# Patient Record
Sex: Female | Born: 1990 | Race: White | Hispanic: No | Marital: Single | Smoking: Never smoker
Health system: Southern US, Community
[De-identification: ages and names within clinical notes are randomized; demographics above are authoritative.]

## PROBLEM LIST (undated history)

## (undated) DIAGNOSIS — I471 Supraventricular tachycardia, unspecified: Secondary | ICD-10-CM

## (undated) DIAGNOSIS — J302 Other seasonal allergic rhinitis: Secondary | ICD-10-CM

## (undated) HISTORY — DX: Supraventricular tachycardia: I47.1

## (undated) HISTORY — DX: Other seasonal allergic rhinitis: J30.2

## (undated) HISTORY — DX: Supraventricular tachycardia, unspecified: I47.10

## (undated) HISTORY — PX: OTHER SURGICAL HISTORY: SHX169

---
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
## 2012-07-09 ENCOUNTER — Ambulatory Visit (INDEPENDENT_AMBULATORY_CARE_PROVIDER_SITE_OTHER): Payer: BC Managed Care – PPO | Admitting: Internal Medicine

## 2012-07-09 ENCOUNTER — Encounter: Payer: Self-pay | Admitting: Internal Medicine

## 2012-07-09 VITALS — BP 113/73 | HR 99 | Ht 62.0 in | Wt 127.0 lb

## 2012-07-09 DIAGNOSIS — I498 Other specified cardiac arrhythmias: Secondary | ICD-10-CM

## 2012-07-09 DIAGNOSIS — I471 Supraventricular tachycardia: Secondary | ICD-10-CM

## 2012-07-09 MED ORDER — DILTIAZEM HCL 30 MG PO TABS: ORAL_TABLET | ORAL | Status: AC

## 2012-07-09 NOTE — Patient Instructions (Addendum)
Your physician recommends that you schedule a follow-up appointment as needed with Dr Johney Frame    Your physician has recommended you make the following change in your medication:  1) Take 1-2 as needed for fast heart rates

## 2012-07-09 NOTE — Progress Notes (Signed)
Primary Care Physician: Cain Saupe, MD  Taylor Carroll is a 22 y.o. female with a h/o SVT who presents for EP consultation.  She reports that since age 68, she has had abrupt onset/ offset of tachypalpitations.  She reports associated presyncope with these episodes.  She reports having an episode with sneezing or coughing but typically, she is unaware of triggers or precipitants for this.  She had an episode 02/24/11 for which she presented to [facility].  She reports that she was having an allergic reaction to nuts and used her epi pen.  She had sustained tachycardia for several hours.  She presented with a short RP narrow complex tachycardia at 221 bpm.  This tachycardia was terminated with adenosine.  She reports associated chest pain and dizziness during episodes.  She has not found vagal maneuvers to be particularly helpful.  Since that time, she has had 2 episodes of tachycardia (2/14 and 5/14).  She presented to the hospital 5/14 but converted to sinus rhythm by the time she arrived.  She is an active 22 yo female.  She works out without difficult.  Today, she denies symptoms of exertional CP, shortness of breath, orthopnea, PND, lower extremity edema, syncope, or neurologic sequela. The patient is tolerating medications without difficulties and is otherwise without complaint today.   Past Medical History  Diagnosis Date  . SVT (supraventricular tachycardia)   . Seasonal allergies    Past Surgical History  Procedure Laterality Date  . None      Current Outpatient Prescriptions  Medication Sig Dispense Refill  . EPINEPHrine (EPI-PEN) 0.3 mg/0.3 mL DEVI Inject into the muscle once.      . Loratadine (CLARITIN PO) Take by mouth.      Taylor Carroll 3-0.02 MG tablet        No current facility-administered medications for this visit.    Allergies  Allergen Reactions  . Other     Tree nuts    History   Social History  . Marital Status: Single    Spouse Name: N/A    Number of  Children: N/A  . Years of Education: N/A   Occupational History  . Not on file.   Social History Main Topics  . Smoking status: Never Smoker   . Smokeless tobacco: Not on file  . Alcohol Use: Yes     Comment: social  . Drug Use: No  . Sexually Active: Not on file   Other Topics Concern  . Not on file   Social History Narrative   Lives in [place].  Attends school at [employer].  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  She wants to be an NP or PA.          Family History  Problem Relation Age of Onset  . Atrial fibrillation      ROS- All systems are reviewed and negative except as per the HPI above  Physical Exam: Filed Vitals:   07/09/12 1030  BP: 113/73  Pulse: 99  Height: 5\' 2"  (1.575 m)  Weight: 127 lb (57.607 kg)    GEN- The patient is well appearing, alert and oriented x 3 today.   Head- normocephalic, atraumatic Eyes-  Sclera clear, conjunctiva pink Ears- hearing intact Oropharynx- clear Neck- supple, no JVP Lymph- no cervical lymphadenopathy Lungs- Clear to ausculation bilaterally, normal work of breathing Heart- Regular rate and rhythm, no murmurs, rubs or gallops, PMI not laterally displaced GI- soft, NT, ND, + BS Extremities- no clubbing,  cyanosis, or edema MS- no significant deformity or atrophy Skin- no rash or lesion Psych- euthymic mood, full affect Neuro- strength and sensation are intact  EKG 02/24/11 reveals short RP SVT at 221 bpm ekg 02/24/11- sinus rhythm 112 bpm, otherwise normal ekg 16 pages from [facility] are reviewed today  Assessment and Plan:  1. SVT The patient has symptomatic short RP narrow complex tachycardia documented.  She has not found vagal maneuvers to be very helpful. Therapeutic strategies for supraventricular tachycardia including medicine and ablation were discussed in detail with the patient today. Risk, benefits, and alternatives to EP study and radiofrequency ablation were also discussed in detail today. These  risks include but are not limited to stroke, bleeding, vascular damage, tamponade, perforation, damage to the heart and other structures, AV block requiring pacemaker, worsening renal function, and death. The patient understands these risk and wishes to further contemplate this option.  She will take cardizem on an as needed basis. She will return to see me as needed.  She will contact my office if she decides to proceed with ablation.

## 2012-07-12 DIAGNOSIS — I471 Supraventricular tachycardia: Secondary | ICD-10-CM | POA: Insufficient documentation

## 2014-05-04 ENCOUNTER — Other Ambulatory Visit: Payer: Self-pay | Admitting: Family Medicine

## 2014-05-04 DIAGNOSIS — T7840XA Allergy, unspecified, initial encounter: Secondary | ICD-10-CM

## 2014-05-04 DIAGNOSIS — E010 Iodine-deficiency related diffuse (endemic) goiter: Secondary | ICD-10-CM

## 2014-06-10 ENCOUNTER — Ambulatory Visit
Admission: RE | Admit: 2014-06-10 | Discharge: 2014-06-10 | Disposition: A | Discharge status: Home / Self Care | Payer: BLUE CROSS/BLUE SHIELD | Source: Ambulatory Visit | Attending: Family Medicine | Admitting: Family Medicine

## 2014-06-10 DIAGNOSIS — T7840XA Allergy, unspecified, initial encounter: Secondary | ICD-10-CM

## 2014-06-10 DIAGNOSIS — E010 Iodine-deficiency related diffuse (endemic) goiter: Secondary | ICD-10-CM

## 2014-11-28 ENCOUNTER — Telehealth: Payer: Self-pay | Admitting: Internal Medicine

## 2014-11-28 NOTE — Telephone Encounter (Signed)
New message    1. What dental office are you calling from? [phone]  2. What is your office phone and fax number? [phone]   3. What type of procedure is the patient having performed? Oral surgery with general anest   4. What date is procedure scheduled? Pending    5. What is your question (ex. Antibiotics prior to procedure, holding medication-we need to know how long dentist wants pt to hold med)? Please advise

## 2014-11-28 NOTE — Telephone Encounter (Addendum)
Patient has not been seen since 2014.  Will need to be seen first.  Spoke with office to see if could get clearance form PCP MD would rather cardiolgy give.  A message has been left with patient to call to schedule

## 2014-11-29 ENCOUNTER — Telehealth: Payer: Self-pay | Admitting: Internal Medicine

## 2014-11-29 NOTE — Telephone Encounter (Signed)
New message   Pt mother is calling because she wants her daughter to see Dr. Johney FrameALLRED   Notes per Efraim Kaufmannmelissa states that pt needs surgical clearance appt  I offered her first available she [facility]

## 2014-11-29 NOTE — Telephone Encounter (Signed)
Follow Up    Pt states she was just speaking with you and she wants a call back

## 2014-11-29 NOTE — Telephone Encounter (Signed)
Will see Dr Johney FrameAllred on 12/19/14 at 9:00am

## 2014-11-30 NOTE — Telephone Encounter (Signed)
Changed appointment to better fir her schedule for grad school

## 2014-12-19 ENCOUNTER — Ambulatory Visit: Payer: BLUE CROSS/BLUE SHIELD | Admitting: Internal Medicine

## 2014-12-21 ENCOUNTER — Ambulatory Visit (INDEPENDENT_AMBULATORY_CARE_PROVIDER_SITE_OTHER): Payer: BLUE CROSS/BLUE SHIELD | Admitting: Internal Medicine

## 2014-12-21 ENCOUNTER — Encounter: Payer: Self-pay | Admitting: Internal Medicine

## 2014-12-21 VITALS — BP 112/62 | HR 87 | Ht 61.0 in | Wt 134.6 lb

## 2014-12-21 DIAGNOSIS — I471 Supraventricular tachycardia: Secondary | ICD-10-CM | POA: Diagnosis not present

## 2014-12-21 NOTE — Patient Instructions (Signed)
Medication Instructions:  Your physician recommends that you continue on your current medications as directed. Please refer to the Current Medication list given to you today.  Labwork: None ordered.  Testing/Procedures: None ordered.  Follow-Up: Your physician recommends that you schedule a follow-up appointment as needed.   Any Other Special Instructions Will Be Listed Below (If Applicable).     If you need a refill on your cardiac medications before your next appointment, please call your pharmacy.   

## 2014-12-21 NOTE — Progress Notes (Signed)
PCP: Cain SaupeFULP, CAMMIE, MD  Taylor Carroll is a 24 y.o. female who presents today for routine electrophysiology followup.  Since last being seen in our clinic, the patient reports doing very well.  She has rare episodes of SVT without known trigger.  Episodes terminate with vagal maneuvers or cardizem prn and typically do not last more than 30 minutes.  She has wisdom teeth extraction planned and presents today for preoperative evaluation.  Today, she denies symptoms of chest pain, shortness of breath,  lower extremity edema, dizziness, presyncope, or syncope.  The patient is otherwise without complaint today.   Past Medical History  Diagnosis Date  . SVT (supraventricular tachycardia) (HCC)   . Seasonal allergies    Past Surgical History  Procedure Laterality Date  . None      ROS- all systems are reviewed and negatives except as per HPI above  Current Outpatient Prescriptions  Medication Sig Dispense Refill  . amoxicillin (AMOXIL) 500 MG capsule Take as directed  0  . diltiazem (CARDIZEM) 30 MG tablet Take 1-2 every 6 hours as needed for fast heart rates 30 tablet 0  . EPINEPHrine (EPI-PEN) 0.3 mg/0.3 mL DEVI Inject into the muscle once.    . fluticasone (FLONASE) 50 MCG/ACT nasal spray Place into the nose.    Marland Kitchen. HYDROcodone-acetaminophen (NORCO) 7.5-325 MG tablet Take 1 tablet by mouth 4 (four) times daily as needed.  0  . ibuprofen (ADVIL,MOTRIN) 600 MG tablet Take as directed  1  . Loratadine (CLARITIN PO) Take by mouth.    [name]. LORYNA 3-0.02 MG tablet      No current facility-administered medications for this visit.    Physical Exam: Filed Vitals:   12/21/14 0826  BP: 112/62  Pulse: 87  Height: 5\' 1"  (1.549 m)  Weight: 134 lb 9.6 oz (61.054 kg)    GEN- The patient is well appearing, alert and oriented x 3 today.   Head- normocephalic, atraumatic Eyes-  Sclera clear, conjunctiva pink Ears- hearing intact Oropharynx- clear Lungs- Clear to ausculation bilaterally, normal  work of breathing Heart- Regular rate and rhythm, no murmurs, rubs or gallops, PMI not laterally displaced GI- soft, NT, ND, + BS Extremities- no clubbing, cyanosis, or edema  ekg today reveals sinus rhythm 87 bpm, PR 108, QRS 82, Qtc 452, no obvious pre-excitation  Assessment and Plan:  1. SVT Doing well at this time Continues to wish to avoid ablation.   No changes at this time  [facility] to proceed with oral [facility].  I have advised that she take cardizem 30mg  po x 1 2-3 hours prior to surgery.   It would be best to avoid lidocaine with epinephrine if possible as epi may trigger an SVT episode.  However, if epi is required I think that it would be reasonable.  Return to see me as needed  Hillis RangeJames Dreydon Cardenas MD, [facility] CenterFACC 12/21/2014 9:16 AM

## 2015-01-24 ENCOUNTER — Other Ambulatory Visit: Payer: Self-pay | Admitting: Family Medicine

## 2015-01-24 DIAGNOSIS — E041 Nontoxic single thyroid nodule: Secondary | ICD-10-CM

## 2015-06-20 ENCOUNTER — Ambulatory Visit
Admission: RE | Admit: 2015-06-20 | Discharge: 2015-06-20 | Disposition: A | Discharge status: Home / Self Care | Payer: BLUE CROSS/BLUE SHIELD | Source: Ambulatory Visit | Attending: Family Medicine | Admitting: Family Medicine

## 2015-06-20 DIAGNOSIS — E041 Nontoxic single thyroid nodule: Secondary | ICD-10-CM

## 2016-09-27 IMAGING — US US SOFT TISSUE HEAD/NECK
1 series · 14 of 25 positions shown · non-contrast
Comparison: None.

CLINICAL DATA: Thyromegaly

EXAM:
THYROID ULTRASOUND
TECHNIQUE: Ultrasound examination of the thyroid gland and adjacent soft
tissues was performed.

[Series 1: us soft tissue head/neck · 0.06mm/px · 14 of 57 slices shown]
[im 1/57]
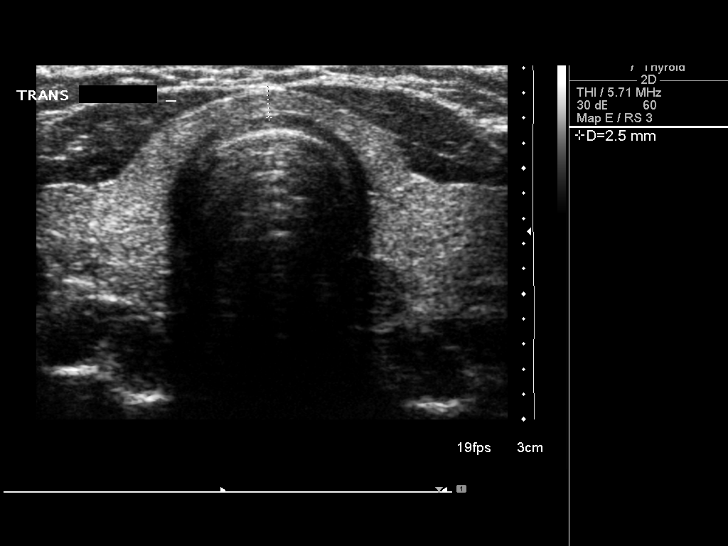
[im 5/57]
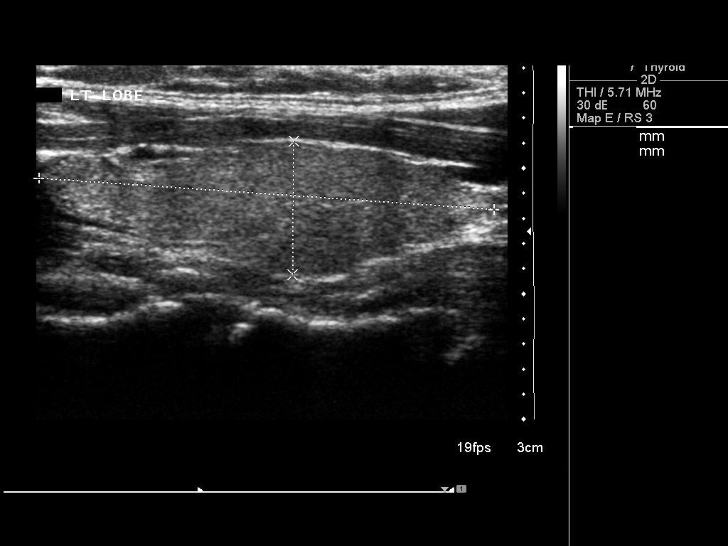
[im 10/57]
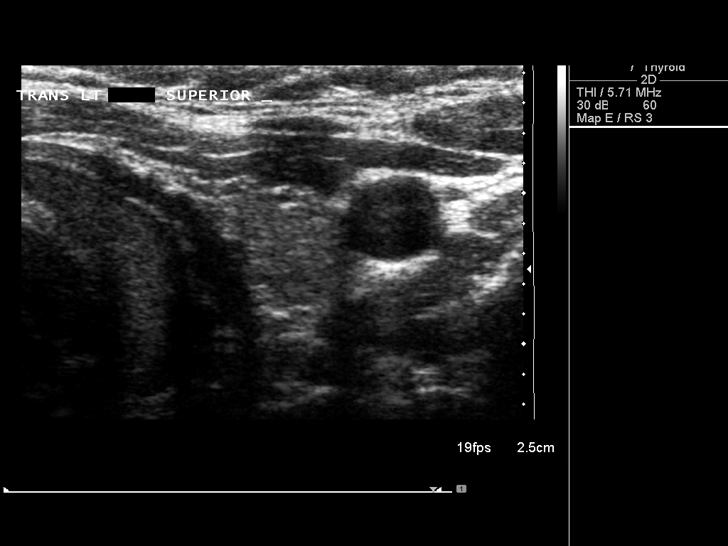
[im 15/57]
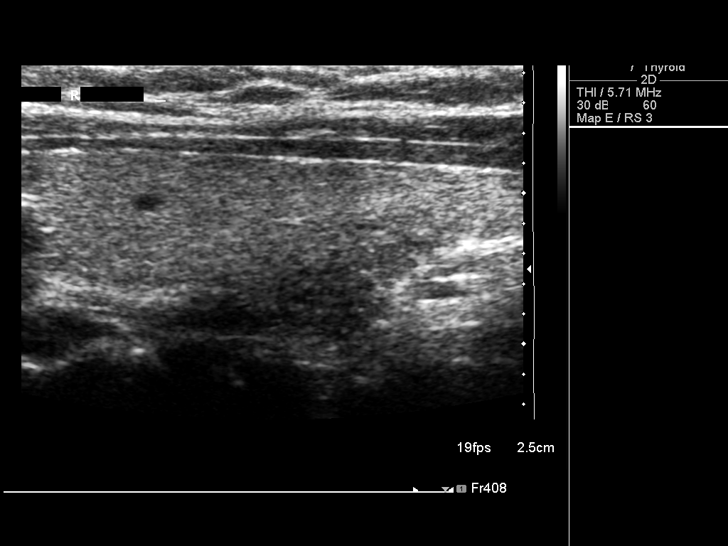
[im 19/57]
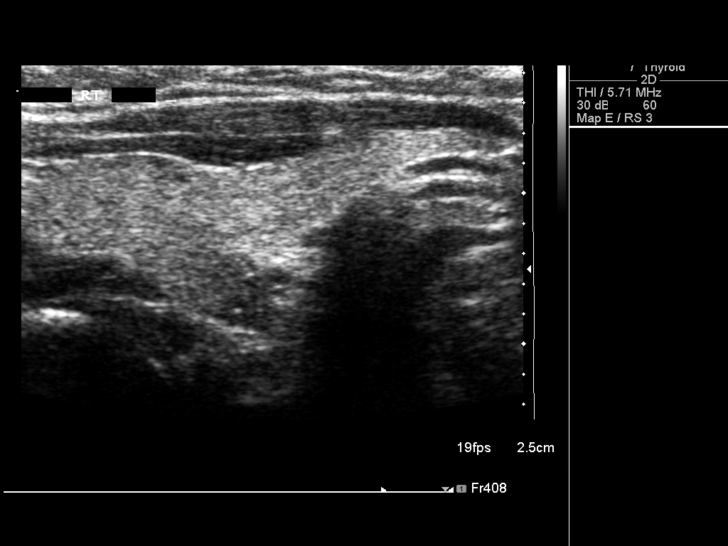
[im 22/57]
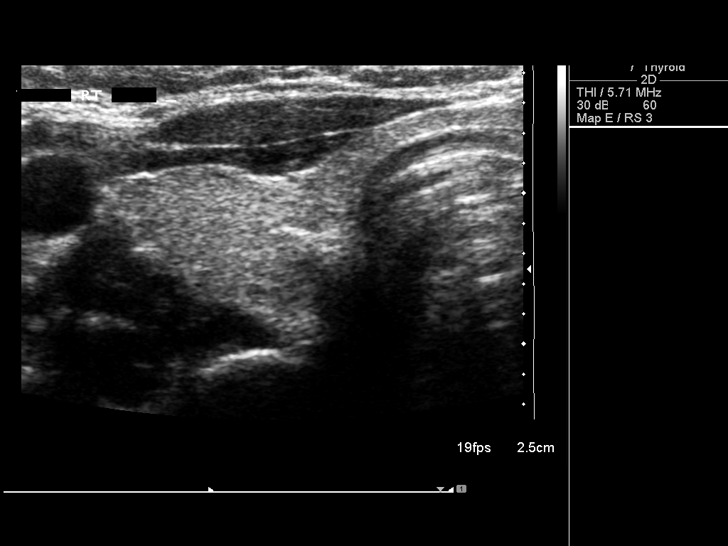
[im 26/57]
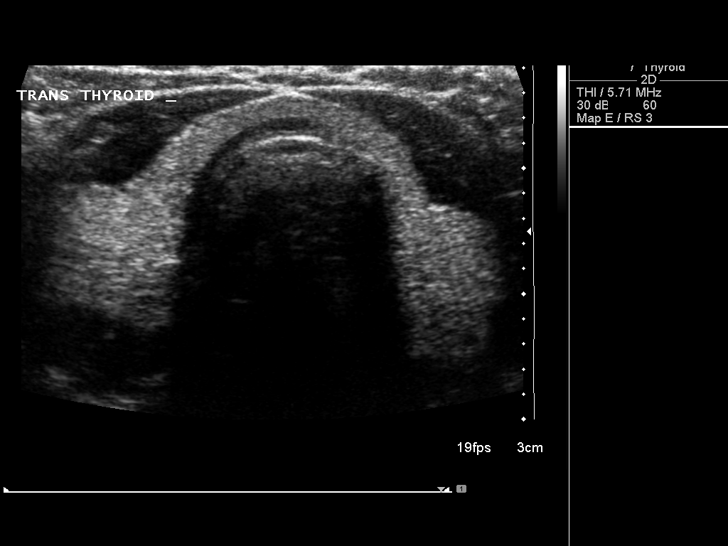
[im 31/57]
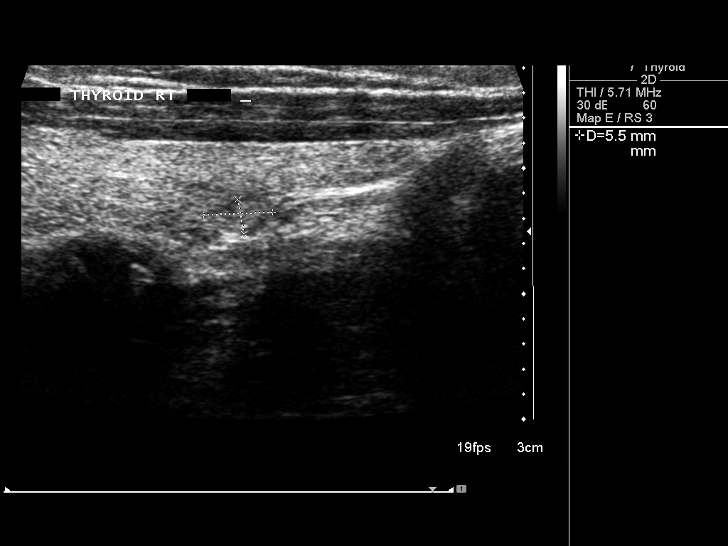
[im 36/57]
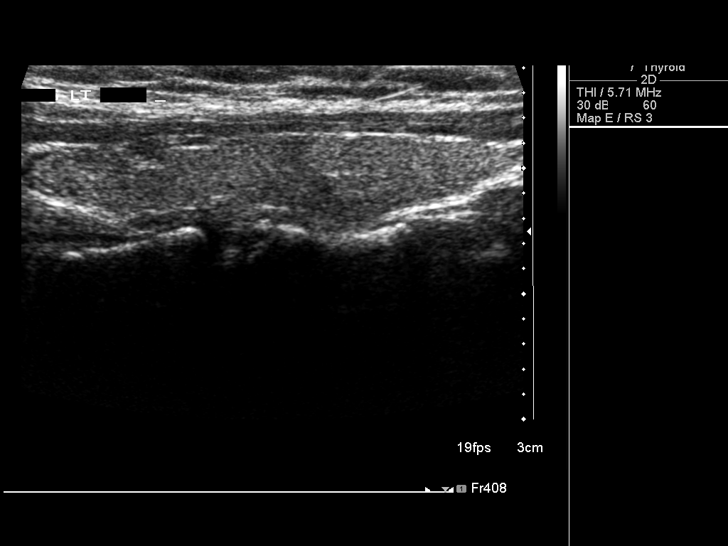
[im 38/57]
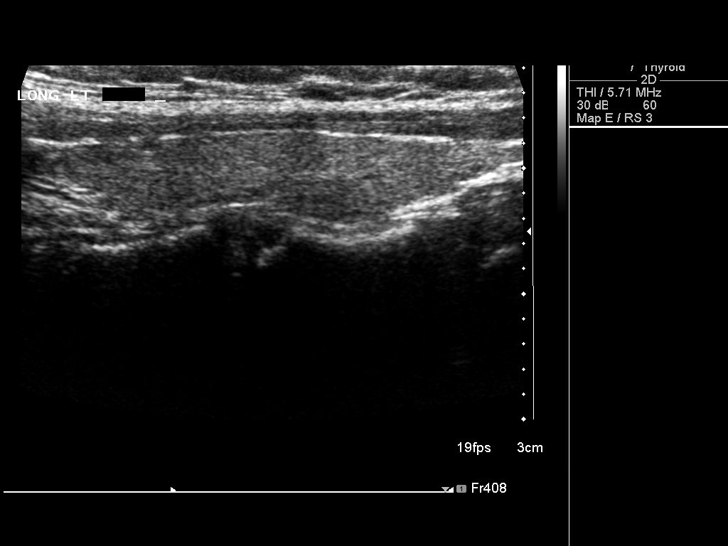
[im 43/57]
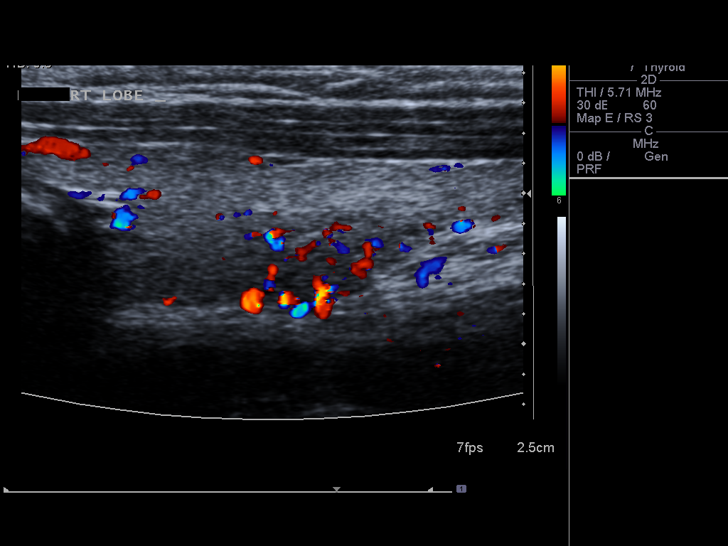
[im 47/57]
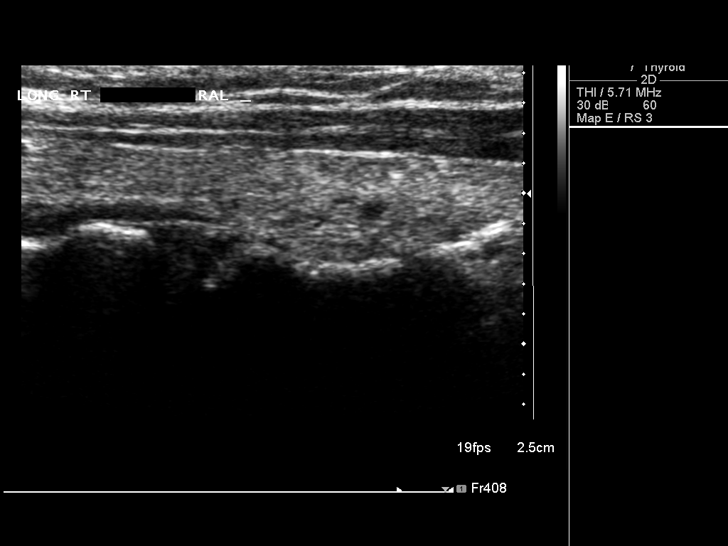
[im 52/57]
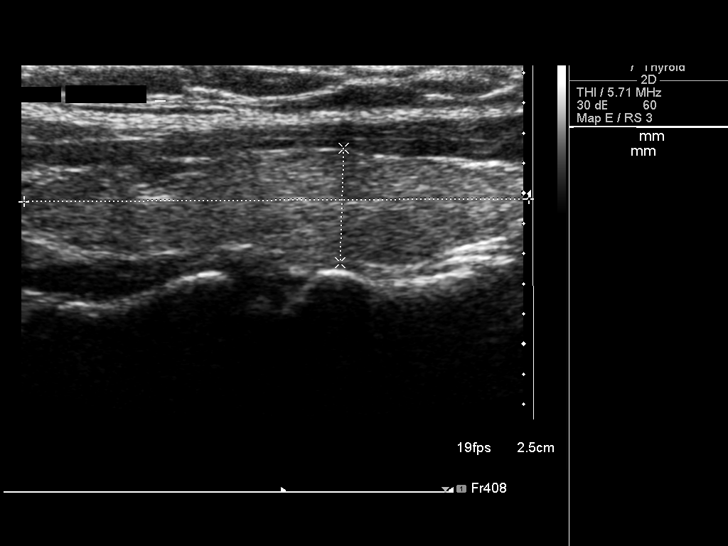
[im 57/57]
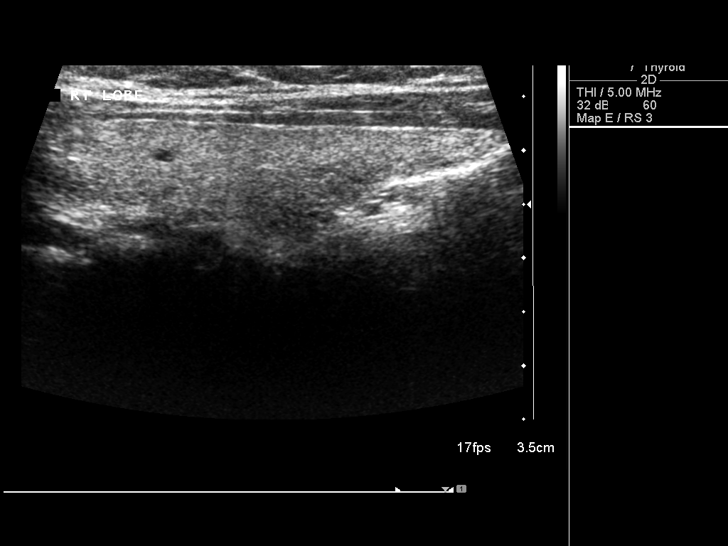

[14 of 25 positions shown; findings below may reference images not displayed]

FINDINGS: Right thyroid lobe

Measurements: 44 x 10 x 16 mm. Homogeneous background parenchyma.
Several hypoechoic nodules, largest 8 x 6 x 6 mm, inferior pole.
Remainder less than 6 mm.

Left thyroid lobe

Measurements: 34 x 8 x 13 mm.  No nodules visualized.

Isthmus

Thickness: 3 mm.  No nodules visualized.

Lymphadenopathy

None visualized.
IMPRESSION: 1. Normal-sized thyroid with several small right nodules measuring
up to 8 mm. Findings do not meet current consensus criteria for
biopsy. Follow-up by clinical exam is recommended. If patient has
known risk factors for thyroid carcinoma, consider follow-up
ultrasound in 12 months. If patient is clinically hyperthyroid,
consider nuclear medicine thyroid uptake and scan. This
recommendation follows the consensus statement: Management of
Thyroid Nodules Detected as US: Society of Radiologists in

## 2017-10-07 IMAGING — US US SOFT TISSUE HEAD/NECK
1 series · 14 of 25 positions shown · non-contrast
Comparison: 06/10/2014

CLINICAL DATA: Follow-up nodules

EXAM:
THYROID ULTRASOUND
TECHNIQUE: Ultrasound examination of the thyroid gland and adjacent soft
tissues was performed.

[Series 1: us soft tissue head/neck · 0.07mm/px · 14 of 40 slices shown]
[im 1/40]
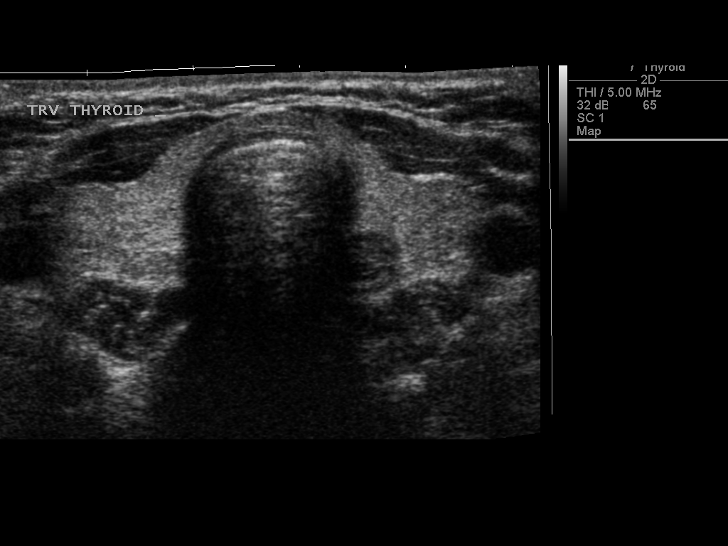
[im 4/40]
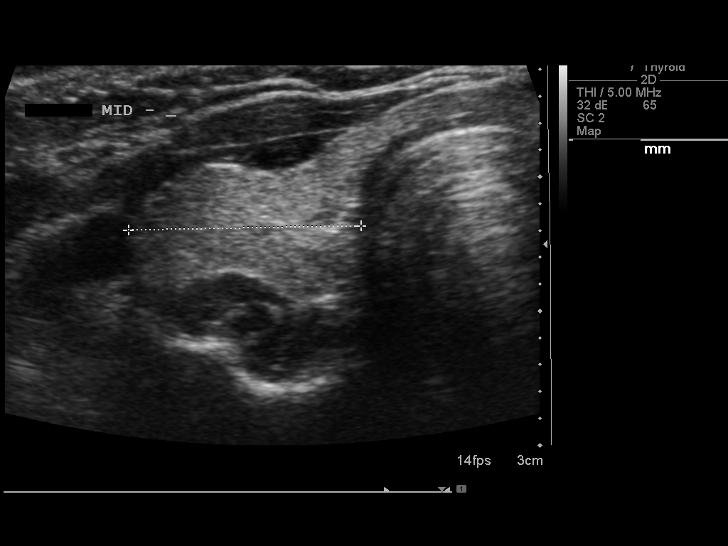
[im 7/40]
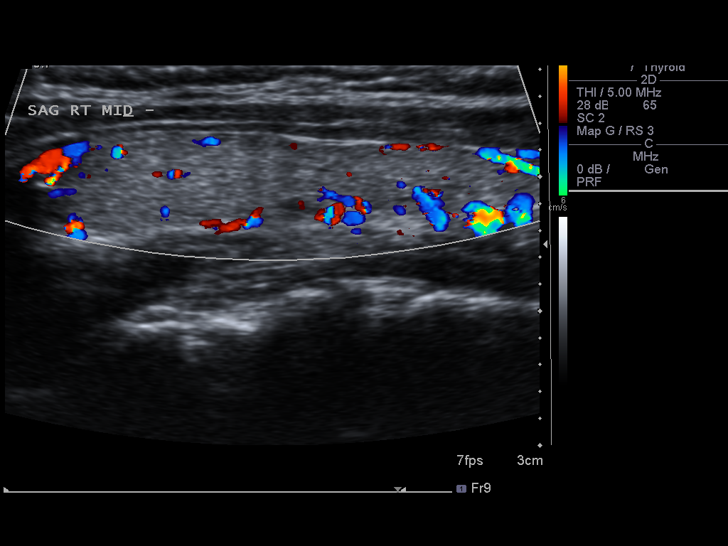
[im 10/40]
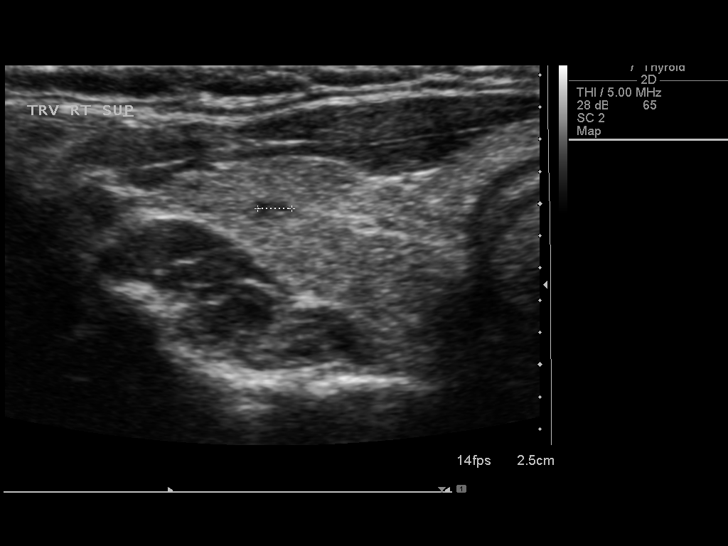
[im 14/40]
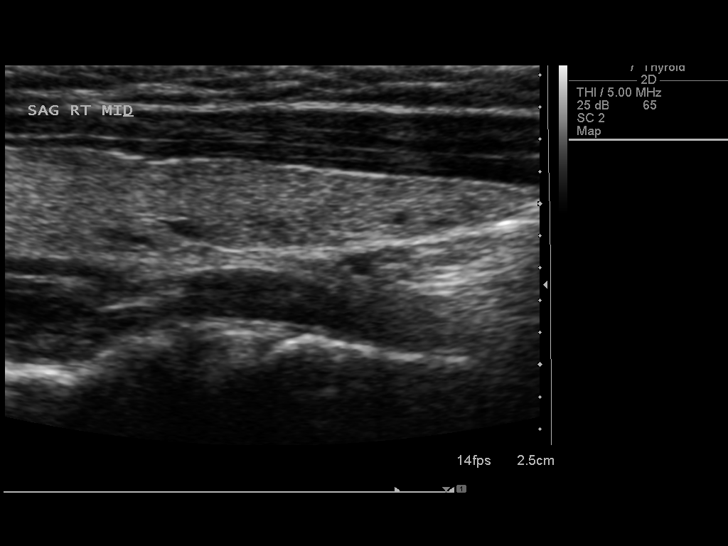
[im 15/40]
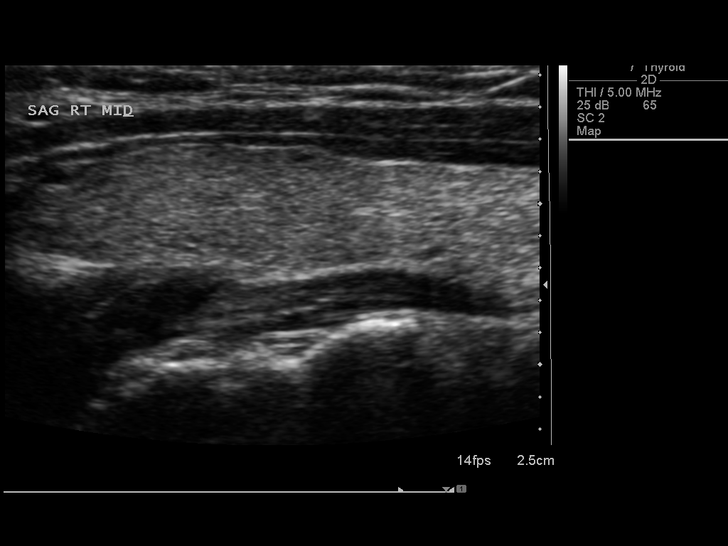
[im 18/40]
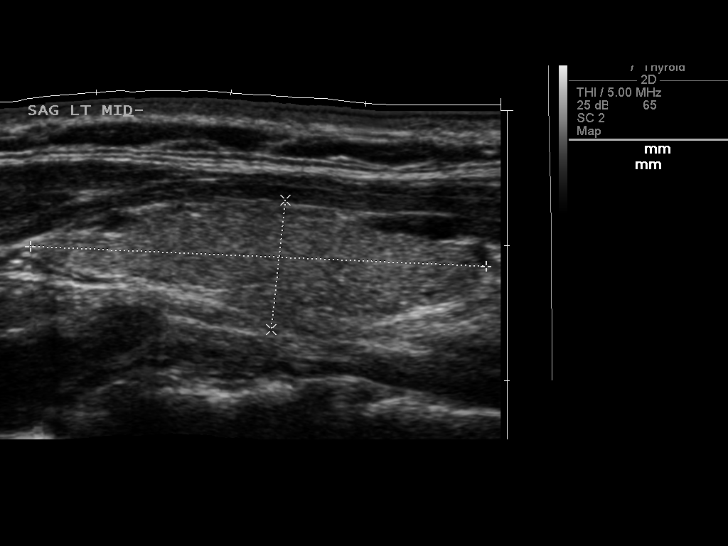
[im 22/40]
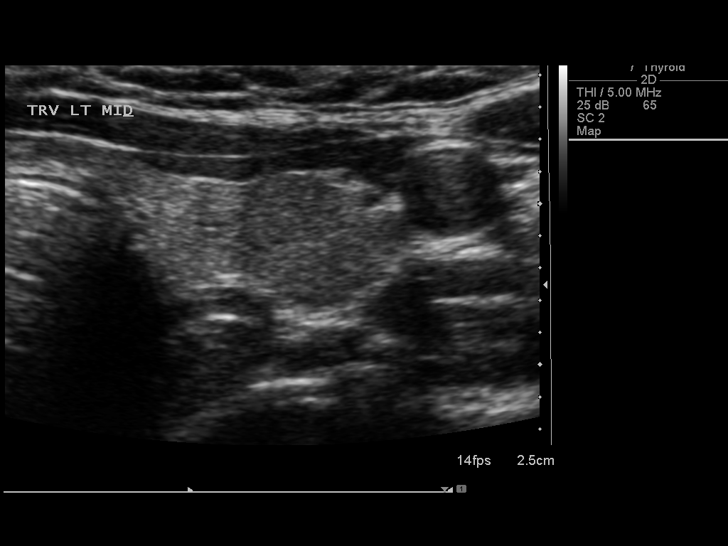
[im 25/40]
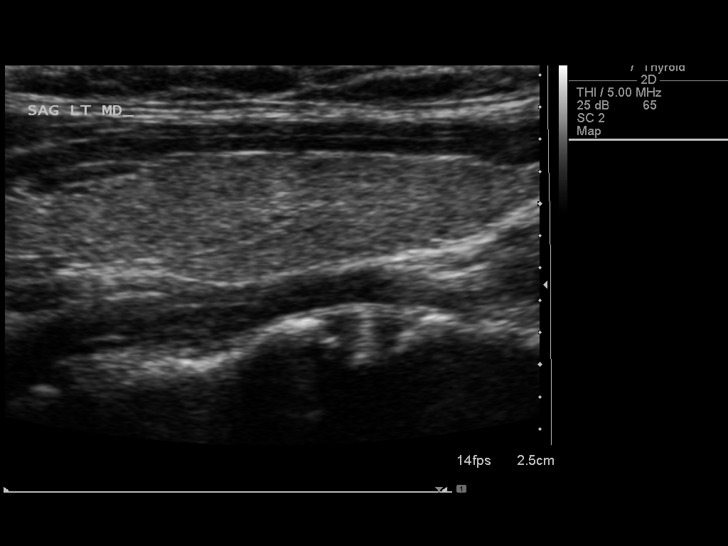
[im 27/40]
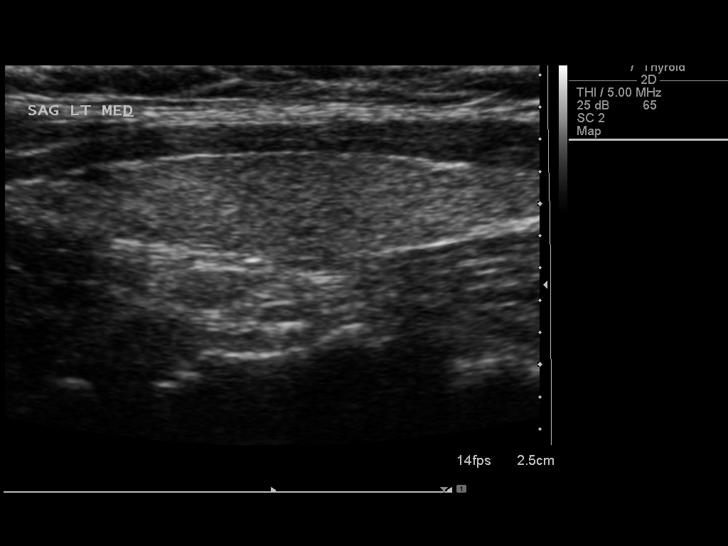
[im 30/40]
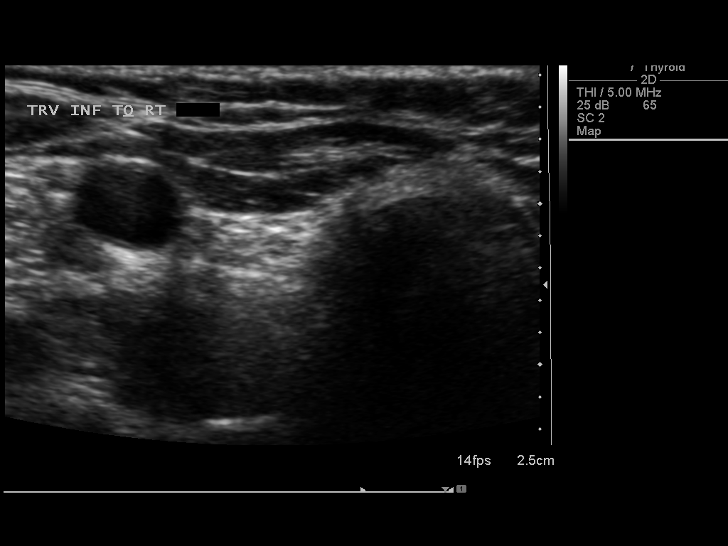
[im 33/40]
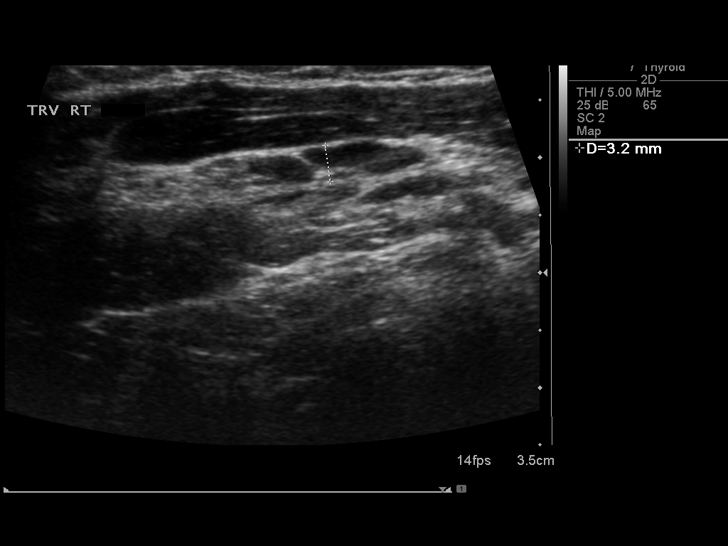
[im 36/40]
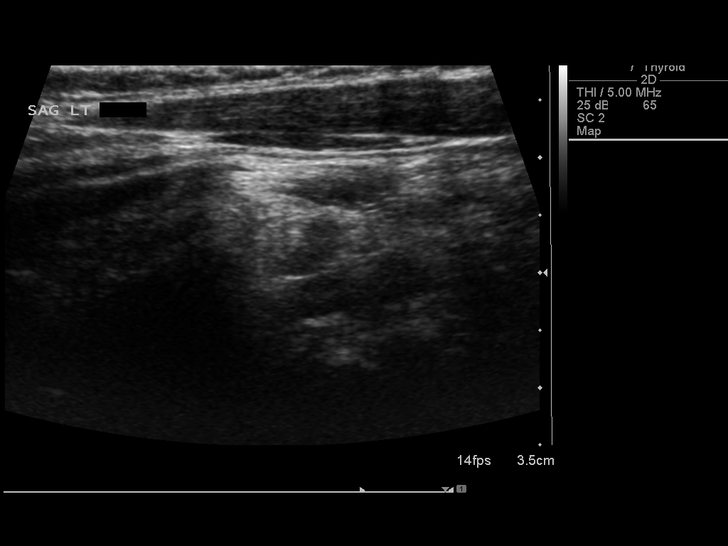
[im 40/40]
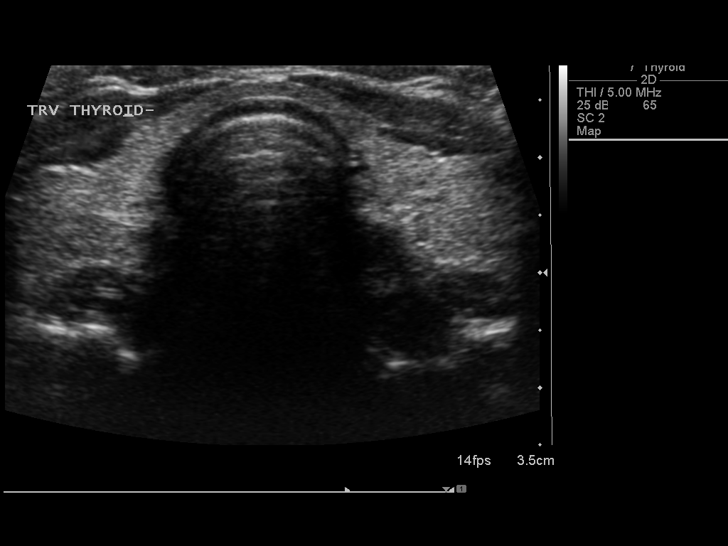

[14 of 25 positions shown; findings below may reference images not displayed]

FINDINGS: Right thyroid lobe

Measurements: 4.3 x 1.0 x 1.7 cm. 2 mm upper pole hypoechoic nodule
previously measured up to 7 mm. Lower pole nodule previously
visualized was not appreciated today

Left thyroid lobe

Measurements: 3.4 x 1.0 x 1.5 cm.  No nodules visualized.

Isthmus

Thickness: 2 mm.  No nodules visualized.

Lymphadenopathy

None visualized.
IMPRESSION: Only 1 nodule in the right upper pole pole was visualized and this
measures 2 mm. The previously visualized right lower pole nodule
cannot be appreciated today. Findings do not meet current SRU
consensus criteria for biopsy. Follow-up by clinical exam is
recommended. If patient has known risk factors for thyroid
carcinoma, consider follow-up ultrasound in 12 months. If patient is
clinically hyperthyroid, consider nuclear medicine thyroid uptake
and scan.Reference: Management of Thyroid Nodules Detected at US:
Society of Radiologists in Ultrasound Consensus Conference
